# Patient Record
Sex: Male | Born: 1980 | Race: Black or African American | Hispanic: No | Marital: Single
Health system: Southern US, Community
[De-identification: ages and names within clinical notes are randomized; demographics above are authoritative.]

---

## 2009-07-27 ENCOUNTER — Emergency Department: Payer: Self-pay | Admitting: Emergency Medicine
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
## 2020-03-09 ENCOUNTER — Ambulatory Visit: Payer: Self-pay | Attending: Internal Medicine

## 2020-03-09 DIAGNOSIS — Z23 Encounter for immunization: Secondary | ICD-10-CM

## 2020-03-09 NOTE — Progress Notes (Signed)
   [facility] Vacc[facility]  Name:  Ethan Mccormick    MRN: [id number] DOB: 06-Sep-1981  03/09/2020  [name] was observed post Covid-19 immunization for 15 minutes without incident. He was provided with Vaccine Information Sheet and instruction to access the V-Safe system.   [name] was instructed to call 911 with any severe reactions post vaccine: Marland Kitchen Difficulty breathing  . Swelling of face and throat  . A fast heartbeat  . A bad rash all over body  . Dizziness and weakness   Immunizations Administered    Name Date Dose VIS Date Route   Pfizer COVID-19 Vaccine 03/09/2020  5:08 PM 0.3 mL 12/24/2018 Intramuscular   Manufacturer: [employer], [employer]   Lot: [id number]   NDC: [address]-0

## 2020-04-02 ENCOUNTER — Ambulatory Visit: Payer: Self-pay | Attending: Internal Medicine

## 2024-10-09 DIAGNOSIS — F250 Schizoaffective disorder, bipolar type: Secondary | ICD-10-CM | POA: Diagnosis not present
